# Patient Record
Sex: Male | Born: 1985 | Race: Black or African American | Hispanic: No | Marital: Married | Smoking: Current every day smoker
Health system: Southern US, Community
[De-identification: ages and names within clinical notes are randomized; demographics above are authoritative.]

---

## 2012-07-23 ENCOUNTER — Emergency Department (HOSPITAL_COMMUNITY): Payer: Self-pay

## 2012-07-23 ENCOUNTER — Emergency Department (HOSPITAL_COMMUNITY)
Admission: EM | Admit: 2012-07-23 | Discharge: 2012-07-24 | Disposition: A | Discharge status: Home / Self Care | Payer: Self-pay | Attending: Emergency Medicine | Admitting: Emergency Medicine

## 2012-07-23 ENCOUNTER — Encounter (HOSPITAL_COMMUNITY): Payer: Self-pay | Admitting: *Deleted

## 2012-07-23 DIAGNOSIS — Z87891 Personal history of nicotine dependence: Secondary | ICD-10-CM | POA: Insufficient documentation

## 2012-07-23 DIAGNOSIS — M25519 Pain in unspecified shoulder: Secondary | ICD-10-CM | POA: Insufficient documentation

## 2012-07-23 DIAGNOSIS — M25559 Pain in unspecified hip: Secondary | ICD-10-CM | POA: Insufficient documentation

## 2012-07-23 DIAGNOSIS — M25512 Pain in left shoulder: Secondary | ICD-10-CM

## 2012-07-23 DIAGNOSIS — M25552 Pain in left hip: Secondary | ICD-10-CM

## 2012-07-23 DIAGNOSIS — R209 Unspecified disturbances of skin sensation: Secondary | ICD-10-CM | POA: Insufficient documentation

## 2012-07-23 DIAGNOSIS — Z87828 Personal history of other (healed) physical injury and trauma: Secondary | ICD-10-CM | POA: Insufficient documentation

## 2012-07-23 MED ORDER — OXYCODONE-ACETAMINOPHEN 5-325 MG PO TABS: 2.0000 | ORAL_TABLET | ORAL | Status: DC | PRN

## 2012-07-23 MED ORDER — IBUPROFEN 800 MG PO TABS: 800.0000 mg | ORAL_TABLET | Freq: Three times a day (TID) | ORAL | Status: DC

## 2012-07-23 MED ORDER — IBUPROFEN 200 MG PO TABS
400.0000 mg | ORAL_TABLET | Freq: Once | ORAL | Status: AC
Start: 2012-07-23 — End: 2012-07-23
  Administered 2012-07-23: 400 mg via ORAL
  Filled 2012-07-23: qty 2

## 2012-07-23 MED ORDER — CYCLOBENZAPRINE HCL 10 MG PO TABS: 10.0000 mg | ORAL_TABLET | Freq: Two times a day (BID) | ORAL | Status: DC | PRN

## 2012-07-23 NOTE — ED Provider Notes (Signed)
History     CSN: [id number]  Arrival date & time 07/23/12  6295   First MD Initiated Contact with Patient 07/23/12 2153      Chief Complaint  Patient presents with  . Back Pain    (Consider location/radiation/quality/duration/timing/severity/associated sxs/prior treatment) HPI Comments: Patient with hx of low back strain presents for L shoulder pain behind L shoulder blade x 4 days that is gradually worsening. Patient states it got worse today when he experienced tingling down his L arm including all 5 fingers and states he was unable to move it. Patient took 2 tabs ibuprofen 5 min prior to onset. Patient works at [employer]	[place] and does a lot of heavy lifting while at work. Denies trauma to injury to the affected area. Denies headache, lightheadedness, dizziness, CP, SOB, abdominal pain, N/V/D.  Patient is a 27 y.o. male presenting with shoulder pain. The history is provided by the patient. No language interpreter was used.  Shoulder Pain This is a new problem. The current episode started in the past 7 days. The problem occurs constantly. The problem has been gradually worsening. Associated symptoms include weakness. Pertinent negatives include no chest pain, chills, fever, joint swelling, nausea or vomiting. Exacerbated by: movement. He has tried NSAIDs for the symptoms. The treatment provided no relief.    History reviewed. No pertinent past medical history.  History reviewed. No pertinent past surgical history.  No family history on file.  History  Substance Use Topics  . Smoking status: Former Games developer  . Smokeless tobacco: Not on file  . Alcohol Use: No      Review of Systems  Constitutional: Negative for fever and chills.  Respiratory: Negative for shortness of breath.   Cardiovascular: Negative for chest pain.  Gastrointestinal: Negative for nausea, vomiting and diarrhea.  Musculoskeletal: Negative for joint swelling.  Skin: Negative for color change.  Neurological:  Positive for weakness.       + tingling  All other systems reviewed and are negative.    Allergies  Review of patient's allergies indicates no known allergies.  Home Medications   Current Outpatient Rx  Name  Route  Sig  Dispense  Refill  . ibuprofen (ADVIL,MOTRIN) 200 MG tablet   Oral   Take 400 mg by mouth every 8 (eight) hours as needed for pain.         . cyclobenzaprine (FLEXERIL) 10 MG tablet   Oral   Take 1 tablet (10 mg total) by mouth 2 (two) times daily as needed for muscle spasms.   20 tablet   0   . ibuprofen (ADVIL,MOTRIN) 800 MG tablet   Oral   Take 1 tablet (800 mg total) by mouth 3 (three) times daily.   21 tablet   0   . oxyCODONE-acetaminophen (PERCOCET/ROXICET) 5-325 MG per tablet   Oral   Take 2 tablets by mouth every 4 (four) hours as needed for pain.   12 tablet   0     BP 117/65  Pulse 83  Temp(Src) 97.9 F (36.6 C)  Resp 20  SpO2 99%  Physical Exam  Nursing note and vitals reviewed. Constitutional: He is oriented to person, place, and time. He appears well-developed and well-nourished. No distress.  HENT:  Head: Normocephalic and atraumatic.  Eyes: Conjunctivae are normal. No scleral icterus.  Neck: Normal range of motion.  Cardiovascular: Normal rate, regular rhythm, normal heart sounds and intact distal pulses.   Pulmonary/Chest: Effort normal and breath sounds normal. He has no wheezes.  Musculoskeletal:  Left shoulder: He exhibits decreased range of motion, tenderness and pain. He exhibits no swelling, no effusion, no crepitus, no deformity and normal pulse.       Left elbow: Normal.       Left wrist: Normal.       Left hip: He exhibits tenderness. He exhibits normal range of motion, normal strength, no swelling, no crepitus and no deformity.       Left knee: Normal.       Left ankle: Normal.       Cervical back: He exhibits no tenderness and no bony tenderness.       Thoracic back: He exhibits no tenderness and no bony  tenderness.       Lumbar back: He exhibits no tenderness and no bony tenderness.  Patient resistant to movement on MSK exam because he says it is "painful"; decreased ROM is secondary to pain  Neurological: He is alert and oriented to person, place, and time. He has normal strength. No cranial nerve deficit or sensory deficit.  Skin: No rash noted. He is not diaphoretic. No erythema.  Psychiatric: He has a normal mood and affect. His behavior is normal.    ED Course  Procedures (including critical care time)  Labs Reviewed - No data to display Dg Hip Complete Left  07/23/2012  *RADIOLOGY REPORT*  Clinical Data: Left hip pain pain.  No injury.  LEFT HIP - COMPLETE 2+ VIEW  Comparison: None.  Findings: The bony pelvis, sacrum and sacroiliac joints are normal. The hip joints appear normal and symmetrical.  Frontal and lateral views of the left hip show no abnormalities involving the left hip joint or the proximal left femur.  No trauma or bone destruction. No soft tissue abnormalities.  IMPRESSION: Normal examination.   Original Report Authenticated By: Sander Radon, M.D.    Dg Shoulder Left  07/23/2012  *RADIOLOGY REPORT*  Clinical Data: Left shoulder pain.  LEFT SHOULDER - 2+ VIEW  Comparison: None.  Findings: Slightly limited external rotation noted.  No fracture or dislocation is identified.  No acute bony findings. The acromial undersurface is type 2 (curved).  IMPRESSION:  1.  Slightly limited external rotation.   Otherwise, no significant abnormality identified.   Original Report Authenticated By: Gaylyn Rong, M.D.      1. Shoulder pain, left   2. 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 Symptoms accompanied by a tingling sensation down his left arm to all 5 of his fingers. Xray of L shoulder and L hip show no evidence of dislocation, fracture or joint space narrowing. Symptoms are likely inflammatory  2/2 to occupational overuse. Patient has no sensory or motor deficits on exam and has good ROM limited only by resistance due to increasing pain. Patient's VSS and is well appearing, nontoxic; ambulates without assistance. Patient will be discharged with ibuprofen to treat inflammation as well as flexeril and percocet to help improve pain. Patient has been given shoulder and hip exercises to try at home and have verbally instructed to ice area for 24-48 hours followed by an alternation of ice and heat. Patient instructed to follow up with PCP and to return to ED if symptoms worsen. Patient verbalizes understanding of this management plan. Patient seen and management discussed with Jaci Carrel, PA-C.        Antony Madura, PA-C 07/23/12 2356

## 2012-07-23 NOTE — ED Notes (Signed)
PA at bedside with PT

## 2012-07-23 NOTE — ED Notes (Signed)
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

## 2012-07-24 NOTE — ED Provider Notes (Signed)
Medical screening examination/treatment/procedure(s) were performed by non-physician practitioner and as supervising physician I was immediately available for consultation/collaboration.  Lyrik Buresh M Damarcus Reggio, MD 07/24/12 0229 

## 2013-07-30 ENCOUNTER — Encounter (HOSPITAL_COMMUNITY): Payer: Self-pay | Admitting: Emergency Medicine

## 2013-07-30 ENCOUNTER — Emergency Department (HOSPITAL_COMMUNITY)
Admission: EM | Admit: 2013-07-30 | Discharge: 2013-07-30 | Disposition: A | Discharge status: Home / Self Care | Payer: No Typology Code available for payment source | Attending: Emergency Medicine | Admitting: Emergency Medicine

## 2013-07-30 DIAGNOSIS — F17200 Nicotine dependence, unspecified, uncomplicated: Secondary | ICD-10-CM | POA: Insufficient documentation

## 2013-07-30 DIAGNOSIS — Z791 Long term (current) use of non-steroidal anti-inflammatories (NSAID): Secondary | ICD-10-CM | POA: Insufficient documentation

## 2013-07-30 DIAGNOSIS — M549 Dorsalgia, unspecified: Secondary | ICD-10-CM | POA: Insufficient documentation

## 2013-07-30 DIAGNOSIS — R630 Anorexia: Secondary | ICD-10-CM | POA: Insufficient documentation

## 2013-07-30 DIAGNOSIS — M26609 Unspecified temporomandibular joint disorder, unspecified side: Secondary | ICD-10-CM | POA: Insufficient documentation

## 2013-07-30 DIAGNOSIS — J988 Other specified respiratory disorders: Secondary | ICD-10-CM

## 2013-07-30 DIAGNOSIS — B9789 Other viral agents as the cause of diseases classified elsewhere: Secondary | ICD-10-CM

## 2013-07-30 DIAGNOSIS — J069 Acute upper respiratory infection, unspecified: Secondary | ICD-10-CM | POA: Insufficient documentation

## 2013-07-30 DIAGNOSIS — B309 Viral conjunctivitis, unspecified: Secondary | ICD-10-CM | POA: Insufficient documentation

## 2013-07-30 MED ORDER — FLUORESCEIN SODIUM 1 MG OP STRP
1.0000 | ORAL_STRIP | Freq: Once | OPHTHALMIC | Status: AC
Start: 2013-07-30 — End: 2013-07-30
  Administered 2013-07-30: 1 via OPHTHALMIC
  Filled 2013-07-30: qty 1

## 2013-07-30 MED ORDER — TETRACAINE HCL 0.5 % OP SOLN
1.0000 [drp] | Freq: Once | OPHTHALMIC | Status: AC
  Administered 2013-07-30: 1 [drp] via OPHTHALMIC
  Filled 2013-07-30: qty 2

## 2013-07-30 NOTE — ED Notes (Signed)
Redness/itching to left eye x 1 day. Reports sore throat x 4 days.

## 2013-07-30 NOTE — ED Provider Notes (Signed)
Medical screening examination/treatment/procedure(s) were performed by non-physician practitioner and as supervising physician I was immediately available for consultation/collaboration.  EKG Interpretation   None         Yony Roulston M Tammela Bales, MD 07/30/13 1710 

## 2013-07-30 NOTE — ED Notes (Signed)
States has been "sick" for 1.5 weeks with URI, sore throat and chills. Yesterday, woke with left eye redness, irritation and drainage.

## 2013-07-30 NOTE — Discharge Instructions (Signed)
Read the information below.  You may return to the Emergency Department at any time for worsening condition or any new symptoms that concern you.  If you develop high fevers that do not resolve with tylenol or ibuprofen, you have difficulty swallowing or breathing, or you are unable to tolerate fluids by mouth, return to the ER for a recheck.     Viral Infections A virus is a type of germ. Viruses can cause:  Minor sore throats.  Aches and pains.  Headaches.  Runny nose.  Rashes.  Watery eyes.  Tiredness.  Coughs.  Loss of appetite.  Feeling sick to your stomach (nausea).  Throwing up (vomiting).  Watery poop (diarrhea). HOME CARE   Only take medicines as told by your doctor.  Drink enough water and fluids to keep your pee (urine) clear or pale yellow. Sports drinks are a good choice.  Get plenty of rest and eat healthy. Soups and broths with crackers or rice are fine. GET HELP RIGHT AWAY IF:   You have a very bad headache.  You have shortness of breath.  You have chest pain or neck pain.  You have an unusual rash.  You cannot stop throwing up.  You have watery poop that does not stop.  You cannot keep fluids down.  You or your child has a temperature by mouth above 102 F (38.9 C), not controlled by medicine.  Your baby is older than 3 months with a rectal temperature of 102 F (38.9 C) or higher.  Your baby is 33 months old or younger with a rectal temperature of 100.4 F (38 C) or higher. MAKE SURE YOU:   Understand these instructions.  Will watch this condition.  Will get help right away if you are not doing well or get worse. Document Released: 05/11/2008 Document Revised: 08/21/2011 Document Reviewed: 10/04/2010 [facility] Patient Information 2014 [place], [place].  Temporomandibular Problems  Temporomandibular joint (TMJ) dysfunction means there are problems with the joint between your jaw and your skull. This is a joint lined by cartilage  like other joints in your body but also has a small disc in the joint which keeps the bones from rubbing on each other. These joints are like other joints and can get inflamed (sore) from arthritis and other problems. When this joint gets sore, it can cause headaches and pain in the jaw and the face. CAUSES  Usually the arthritic types of problems are caused by soreness in the joint. Soreness in the joint can also be caused by overuse. This may come from grinding your teeth. It may also come from mis-alignment in the joint. DIAGNOSIS Diagnosis of this condition can often be made by history and exam. Sometimes your caregiver may need X-rays or an MRI scan to determine the exact cause. It may be necessary to see your dentist to determine if your teeth and jaws are lined up correctly. TREATMENT  Most of the time this problem is not serious; however, sometimes it can persist (become chronic). When this happens medications that will cut down on inflammation (soreness) help. Sometimes a shot of cortisone into the joint will be helpful. If your teeth are not aligned it may help for your dentist to make a splint for your mouth that can help this problem. If no physical problems can be found, the problem may come from tension. If tension is found to be the cause, biofeedback or relaxation techniques may be helpful. HOME CARE INSTRUCTIONS   Later in the day, applications  of ice packs may be helpful. Ice can be used in a plastic bag with a towel around it to prevent frostbite to skin. This may be used about every 2 hours for 20 to 30 minutes, as needed while awake, or as directed by your caregiver.  Only take over-the-counter or prescription medicines for pain, discomfort, or fever as directed by your caregiver.  If physical therapy was prescribed, follow your caregiver's directions.  Wear mouth appliances as directed if they were given. Document Released: 02/21/2001 Document Revised: 08/21/2011 Document  Reviewed: 05/31/2008 [facility] Patient Information 2014 [place].  Viral Conjunctivitis Conjunctivitis is an irritation (inflammation) of the clear membrane that covers the white part of the eye (the conjunctiva). The irritation can also happen on the underside of the eyelids. Conjunctivitis makes the eye red or pink in color. This is what is commonly known as pink eye. Viral conjunctivitis can spread easily (contagious). CAUSES   Infection from virus on the surface of the eye.  Infection from the irritation or injury of nearby tissues such as the eyelids or cornea.  More serious inflammation or infection on the inside of the eye.  Other eye diseases.  The use of certain eye medications. SYMPTOMS  The normally white color of the eye or the underside of the eyelid is usually pink or red in color. The pink eye is usually associated with irritation, tearing and some sensitivity to light. Viral conjunctivitis is often associated with a clear, watery discharge. If a discharge is present, there may also be some blurred vision in the affected eye. DIAGNOSIS  Conjunctivitis is diagnosed by an eye exam. The eye specialist looks for changes in the surface tissues of the eye which take on changes characteristic of the specific types of conjunctivitis. A sample of any discharge may be collected on a Q-Tip (sterile swap). The sample will be sent to a lab to see whether or not the inflammation is caused by bacterial or viral infection. TREATMENT  Viral conjunctivitis will not respond to medicines that kill germs (antibiotics). Treatment is aimed at stopping a bacterial infection on top of the viral infection. The goal of treatment is to relieve symptoms (such as itching) with antihistamine drops or other eye medications.  HOME CARE INSTRUCTIONS   To ease discomfort, apply a cool, clean wash cloth to your eye for 10 to 20 minutes, 3 to 4 times a day.  Gently wipe away any drainage from the eye with  a warm, wet washcloth or a cotton ball.  Wash your hands often with soap and use paper towels to dry.  Do not share towels or washcloths. This may spread the infection.  Change or wash your pillowcase every day.  You should not use eye make-up until the infection is gone.  Stop using contacts lenses. Ask your eye professional how to sterilize or replace them before using again. This depends on the type of contact lenses used.  Do not touch the edge of the eyelid with the eye drop bottle or ointment tube when applying medications to the affected eye. This will stop you from spreading the infection to the other eye or to others. SEEK IMMEDIATE MEDICAL CARE IF:   The infection has not improved within 3 days of beginning treatment.  A watery discharge from the eye develops.  Pain in the eye increases.  The redness is spreading.  Vision becomes blurred.  An oral temperature above 102 F (38.9 C) develops, or as your caregiver suggests.  Facial  pain, redness or swelling develops.  Any problems that may be related to the prescribed medicine develop. MAKE SURE YOU:   Understand these instructions.  Will watch your condition.  Will get help right away if you are not doing well or get worse. Document Released: 05/29/2005 Document Revised: 08/21/2011 Document Reviewed: 01/16/2008 [facility] Patient Information 2014 [place], [place].\   Emergency Department Resource Guide 1) Find a Doctor and Pay Out of Pocket Although you won't have to find out who is covered by your insurance plan, it is a good idea to ask around and get recommendations. You will then need to call the office and see if the doctor you have chosen will accept you as a new patient and what types of options they offer for patients who are self-pay. Some doctors offer discounts or will set up payment plans for their patients who do not have insurance, but you will need to ask so you aren't surprised when you get to your  appointment.  2) Contact Your Local Health Department Not all health departments have doctors that can see patients for sick visits, but many do, so it is worth a call to see if yours does. If you don't know where your local health department is, you can check in your phone book. The CDC also has a tool to help you locate your state's health department, and many state websites also have listings of all of their local health departments.  3) Find a Walk-in Clinic If your illness is not likely to be very severe or complicated, you may want to try a walk in clinic. These are popping up all over the country in pharmacies, drugstores, and shopping centers. They're usually staffed by nurse practitioners or physician assistants that have been trained to treat common illnesses and complaints. They're usually fairly quick and inexpensive. However, if you have serious medical issues or chronic medical problems, these are probably not your best option.  No Primary Care Doctor: - Call Health Connect at  [phone] - they can help you locate a primary care doctor that  accepts your insurance, provides certain services, etc. - Physician Referral Service- [phone]  Chronic Pain Problems: Organization         Address  Phone   Notes  [facility]  [phone] Patients need to be referred by their primary care doctor.   Medication Assistance: Organization         Address  Phone   Notes  [facility] Medication [facility] [address] [place] [place], [place] [address] [phone] --Must be a resident of [facility] -- Must have NO insurance coverage whatsoever (no Medicaid/ Medicare, etc.) -- The pt. MUST have a primary care doctor that directs their care regularly and follows them in the community   [facility]ist  [phone]   [employer]  [phone]    Agencies that provide inexpensive medical care: Organization         Address  Phone   Notes  [facility]  [phone]   [facility]    [phone]   [facility] [address] [place], [place] [address] [phone]   [facility] [address] [place] [place] [phone]   Planned Parenthood    [phone]   [facility]    [phone]   Community Health and [facility]  [address] [place], [employer] Phone:  (  [phone], Fax:  [phone] Hours of Operation:  9 am - 6 pm, M-F.  Also accepts Medicaid/Medicare and self-pay.  [facility]  [address], [place] Phone: [phone], Fax: [phone]. Hours of Operation:  8:30 am - 5:30 pm, M-F.  Also accepts Medicaid and self-pay.  [facility] [place] [address] [place] Phone: [phone]   [facility] [address] Natasha Bence [place], [place] [phone], Ext. [phone] Mondays & Thursdays: 7-9 AM.  First 15 patients are seen on a first come, first serve basis.    Medicaid-accepting [facility] Providers:  Organization         Address  Phone   Notes  [facility] [address], [place] [phone] Also accepts self-pay patients.  [facility] [address] Laurell Josephs [place], [place]  [phone]   [facility] [address], [place] [phone]   [facility] [address] [place] [phone]   Renaye Rakers [address], [place]   [phone] Only accepts [place] Access [place] patients after they have their name applied to their card.   Self-Pay (no insurance) in [facility] Johany [place]:  Organization         Address  Phone   Notes  Sickle Cell Patients, [employer] Internal Medicine [address] [place], [place] [phone]   [facility] [address] [place], [place] [phone]   [facility]  [place]  [address], [place] [phone]   [facility]/Dr. Osei-Bonsu  [address], [place] or [address], [place] [phone] Phone number for both [place] and [place] locations is the same.  Urgent Medical and [facility] [address] [place] [phone]   [facility] [address], [place] or [address] [phone]   [facility] [address] [place] [phone], phone; [phone], fax Sees patients 1st and 3rd Saturday of every month.  Must not qualify for public or private insurance (i.e. Medicaid, Medicare, [place] Health Choice, Veterans' Benefits)  Household income should be no more than 200% of the poverty level The clinic cannot treat you if you are pregnant or think you are pregnant  Sexually transmitted diseases are not treated at the clinic.    Dental Care: Organization         Address  Phone  Notes  [facility] Department of [facility] [address] [place], [place] [phone] Accepts children up to age 75 who are enrolled in [place] or [place] Health Choice; pregnant women with a Medicaid card; and children who have applied for Medicaid or [place] Health Choice, but were declined, whose parents can pay a reduced fee at time of service.  [facility]  [address], [place] [phone] Accepts children up to age 29 who are enrolled in [place] or [place] Health Choice; pregnant women with a Medicaid card; and children who have applied for Medicaid or [place] Health Choice, but were declined, whose parents can pay a reduced fee at time of service.  [facility]  [address] [place], [place] [phone] Patients are seen by appointment only. Walk-ins are  not accepted. [facility] will see patients 48 years of age and  older. Monday - Tuesday (8am-5pm) Most Wednesdays (8:30-5pm) $30 per visit, cash only  [facility] Adult Dental [facility]  [address], [facility] [phone] Patients are seen by appointment only. Walk-ins are not accepted. [facility] will see patients 108 years of age and older. One Wednesday Evening (Monthly: Volunteer Based).  $30 per visit, cash only  [employer]  [phone] for adults; Children under age 57, call Graduate Pediatric Dentistry at [phone]. Children aged 20-14, please call [phone] to request a pediatric application.  Dental services are provided in all areas of dental care including fillings, crowns and bridges, complete and partial dentures, implants, gum treatment, root canals, and extractions. Preventive care is also provided. Treatment is provided to both adults and children. Patients are selected via a lottery and there is often a waiting list.   [facility] [address], [place]  [phone] [email or web address]   [facility] [address] [place], [place] [phone], Ext. [phone] Second and Fourth Thursday of each month, opens at 6:30 AM; Clinic ends at 9 AM.  Patients are seen on a first-come first-served basis, and a limited number are seen during each clinic.   [facility]  [address] Ether Griffins [place], [place] [phone]   Eligibility Requirements You must have lived in [place], [place], or [place] counties for at least the last three months.   You cannot be eligible for state or federal sponsored [place], including [employer], [place], or [employer].   You generally cannot be eligible for healthcare insurance through your employer.    How to apply: Eligibility screenings are held every Tuesday and Wednesday afternoon from 1:00 pm until 4:00 pm. You do not need an appointment for the interview!  [facility] [address]  [place], [place] [phone]   [facility] Health Department  [phone]   [facility] Health Department  [phone]   [facility] Health Department  [phone]    Behavioral Health Resources in the Community: Intensive Outpatient Programs Organization         Address  Phone  Notes  [facility] [address] [place], [place] [phone]   [facility] Outpatient [address] [place], [place] [phone]   [facility] [address], [place], [place]  [phone]   [facility] Mental Health [address]  [place], [place] [phone] or [phone]   Substance Abuse Resources Organization         Address  Phone  Notes  [facility] Drug Services  [phone]   [facility]  [phone]   [facility] [place]  [phone]   Floydene Flock  [phone]   Residential & Outpatient Substance Abuse Program  [phone]   Psychological Services Organization         Address  Phone  Notes  [facility] Behavioral Health  [phone]   [facility]  [phone]   [facility] Mental Health [address] [place] [phone] or [phone]    Mobile Crisis Teams Organization         Address  Phone  Notes  [facility], Mobile Crisis Care Unit  [phone]   [facility]  [address] [place], [place] [phone]   [facility] [address] [place] [phone]    Self-Help/Support  Groups Organization         Address  Phone             Notes  [facility] [place] - variety of support groups  336- I7437963 Call for more information  Narcotics Anonymous (NA), [facility] [address], [employer] [place]  2 meetings at this location   Statistician         Address  Phone  Notes  [facility] [address],    [place]  [phone]   [facility]  [address] [place] [address], [place], [place] [phone]   [facility] [address] [place], [place] [phone] Admissions: 8am-3pm M-F  [facility] [address]    [place], [place] [phone]   [facility] [address] [place], [place], [place] [phone]   [facility] [address],  [place], [place] [phone]   [facility] - Intensive Outpatient [address] Laurell Josephs [address], [place], [place] [phone]   [facility] (Addiction [facility] [address] [place].,  [place], [place] [phone] or [phone]   [facility] ([facility]) [address], [place], [place] [phone] Accepts Medicaid  [facility] [address]  [place] [phone] Substance Abuse/Addiction Treatment   [facility] Organization         Address  Phone  Notes  [facility]  [phone]   Angie Fava, PhD [address] Ervin Knack [place], [place]   [phone] or [phone]   [facility] Behavioral   [address] [place], [place] [phone]   [facility] [address] [place], [place] [phone] Insurance/Medicaid/sponsorship through [facility] [address]                                    [place], [place] [phone] Therapy/tele-psych/case  [facility] [address], [place] [phone]    Dr. Lolly Mustache  [phone]   [facility]  [employer] [facility] Dept. 1) [address], [place] 2) [address], [place] 3)  [address], [place] [phone]  [phone]   [facility] Child Abuse Hotline ([phone]) [phone] or [phone] (After Hours)

## 2013-07-30 NOTE — ED Provider Notes (Signed)
CSN: [id number]     Arrival date & time 07/30/13  1138 History  This chart was scribed for non-physician practitioner, Trixie Dredge, PA-C working with Lyanne Co, MD by Greggory Stallion, ED scribe. This patient was seen in room TR04C/TR04C and the patient's care was started at 1:23 PM.    Chief Complaint  Patient presents with  . Conjunctivitis  . Sore Throat    The history is provided by the patient. No language interpreter was used.   HPI Comments: Kenneth Cisneros is a 28 y.o. male who presents to the Emergency Department complaining of left eye redness, itching and blurry vision that started yesterday. Denies foreign body sensation or trauma to his eye. He states he has also had a sore throat, rhinorrhea, congestion and productive cough of yellow sputum that started 1.5 weeks ago. Pt has also had an intermittent fever, the highest being 103 about 3-4 days ago and decreased appetite. He has had throbbing left sided back pain that radiates around his flank into his thigh and groin area. The pain lasted about 15 minutes and then went away. Denies current pain. Denies double vision, SOB, abdominal pain, emesis, diarrhea, hematochezia, hematuria, dysuria, frequency, penile pain, scrotal swelling, penile discharge. Denies sick contacts. Pt is also complaining of his jaw locking up but denies any pain.   History reviewed. No pertinent past medical history. History reviewed. No pertinent past surgical history. No family history on file. History  Substance Use Topics  . Smoking status: Current Every Day Smoker    Types: Cigarettes  . Smokeless tobacco: Not on file  . Alcohol Use: No    Review of Systems  Constitutional: Positive for fever and appetite change.  HENT: Positive for congestion, rhinorrhea and sore throat.   Eyes: Positive for redness, itching and visual disturbance.  Respiratory: Positive for cough. Negative for shortness of breath.   Gastrointestinal: Negative for vomiting, abdominal  pain, diarrhea and blood in stool.  Genitourinary: Negative for dysuria, frequency, hematuria, discharge, scrotal swelling and penile pain.  Musculoskeletal: Positive for back pain and myalgias.  All other systems reviewed and are negative.   Allergies  Review of patient's allergies indicates no known allergies.  Home Medications   Current Outpatient Rx  Name  Route  Sig  Dispense  Refill  . cyclobenzaprine (FLEXERIL) 10 MG tablet   Oral   Take 1 tablet (10 mg total) by mouth 2 (two) times daily as needed for muscle spasms.   20 tablet   0   . ibuprofen (ADVIL,MOTRIN) 200 MG tablet   Oral   Take 400 mg by mouth every 8 (eight) hours as needed for pain.         Marland Kitchen ibuprofen (ADVIL,MOTRIN) 800 MG tablet   Oral   Take 1 tablet (800 mg total) by mouth 3 (three) times daily.   21 tablet   0   . oxyCODONE-acetaminophen (PERCOCET/ROXICET) 5-325 MG per tablet   Oral   Take 2 tablets by mouth every 4 (four) hours as needed for pain.   12 tablet   0    BP 131/85  Pulse 83  Temp(Src) 98 F (36.7 C) (Oral)  Resp 16  SpO2 100%  Physical Exam  Nursing note and vitals reviewed. Constitutional: He appears well-developed and well-nourished. No distress.  HENT:  Head: Normocephalic and atraumatic.  Nose: Right sinus exhibits no maxillary sinus tenderness and no frontal sinus tenderness. Left sinus exhibits no maxillary sinus tenderness and no frontal sinus tenderness.  Mouth/Throat: Oropharynx is clear and moist.  TMJ clicks bilaterally with raising and lowering jaw.  No overlying erythema, no locking, no swelling.   Eyes: EOM are normal. Left conjunctiva is injected.  Neck: Neck supple.  Cardiovascular: Normal rate, regular rhythm and normal heart sounds.   Pulmonary/Chest: Effort normal and breath sounds normal. No respiratory distress. He has no wheezes. He has no rales.  Neurological: He is alert.  Skin: He is not diaphoretic.    ED Course  Procedures (including  critical care time)  DIAGNOSTIC STUDIES: Oxygen Saturation is 100% on RA, normal by my interpretation.    COORDINATION OF CARE: 1:28 PM-Discussed treatment plan which includes warm compresses with pt at bedside and pt agreed to plan. Advised pt to follow up with a dentist for his jaw.   Labs Review Labs Reviewed - No data to display Imaging Review No results found.  EKG Interpretation   None      Filed Vitals:   07/30/13 1149  BP: 131/85  Pulse: 83  Temp: 98 F (36.7 C)  Resp: 16     MDM   Final diagnoses:  Viral conjunctivitis  Viral respiratory infection  TMJ (temporomandibular joint disorder)    Afebrile, nontoxic patient with constellation of symptoms suggestive of viral syndrome.  Conjunctivitis likely part of this syndrome.  Pt with longstanding popping of bilateral TMJs, no change in this recently.  No concerning findings on exam.  Discharged home with supportive care, PCP follow up.   I personally performed the services described in this documentation, which was scribed in my presence. The recorded information has been reviewed and is accurate.   Trixie Dredgemily Layla Kesling, PA-C 07/30/13 1541

## 2013-12-11 ENCOUNTER — Ambulatory Visit: Payer: No Typology Code available for payment source | Attending: Internal Medicine

## 2014-02-02 ENCOUNTER — Ambulatory Visit: Payer: Self-pay | Admitting: Family Medicine

## 2014-02-14 IMAGING — CR DG HIP (WITH OR WITHOUT PELVIS) 2-3V*L*
3 series · 3 of 3 positions shown · non-contrast
Comparison: None.

CLINICAL DATA: Left hip pain pain.  No injury.

LEFT HIP - COMPLETE 2+ VIEW

[t pelvis ap]
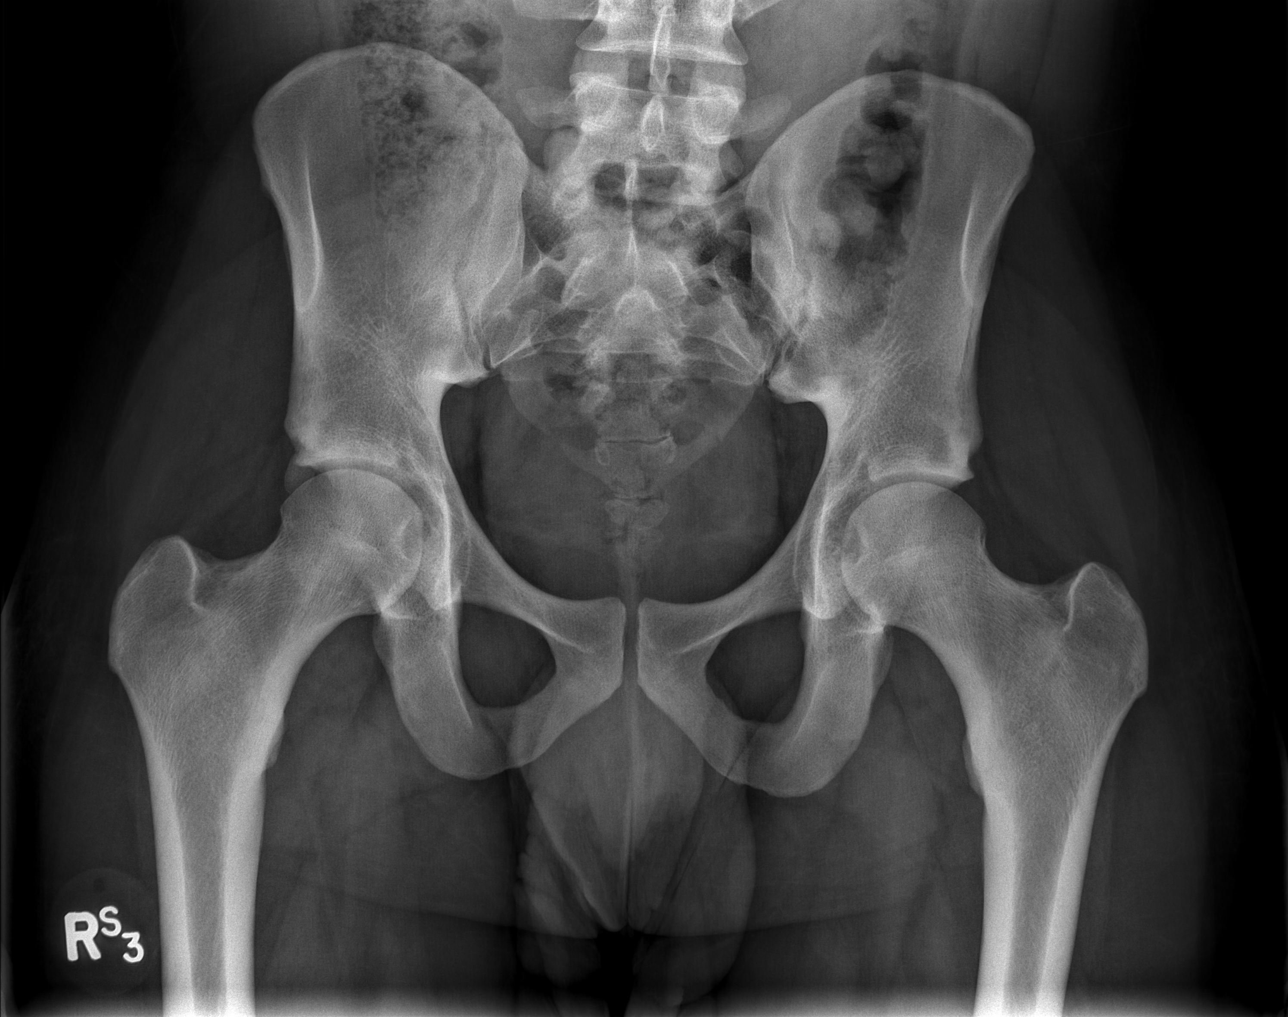

[t hip ap left]
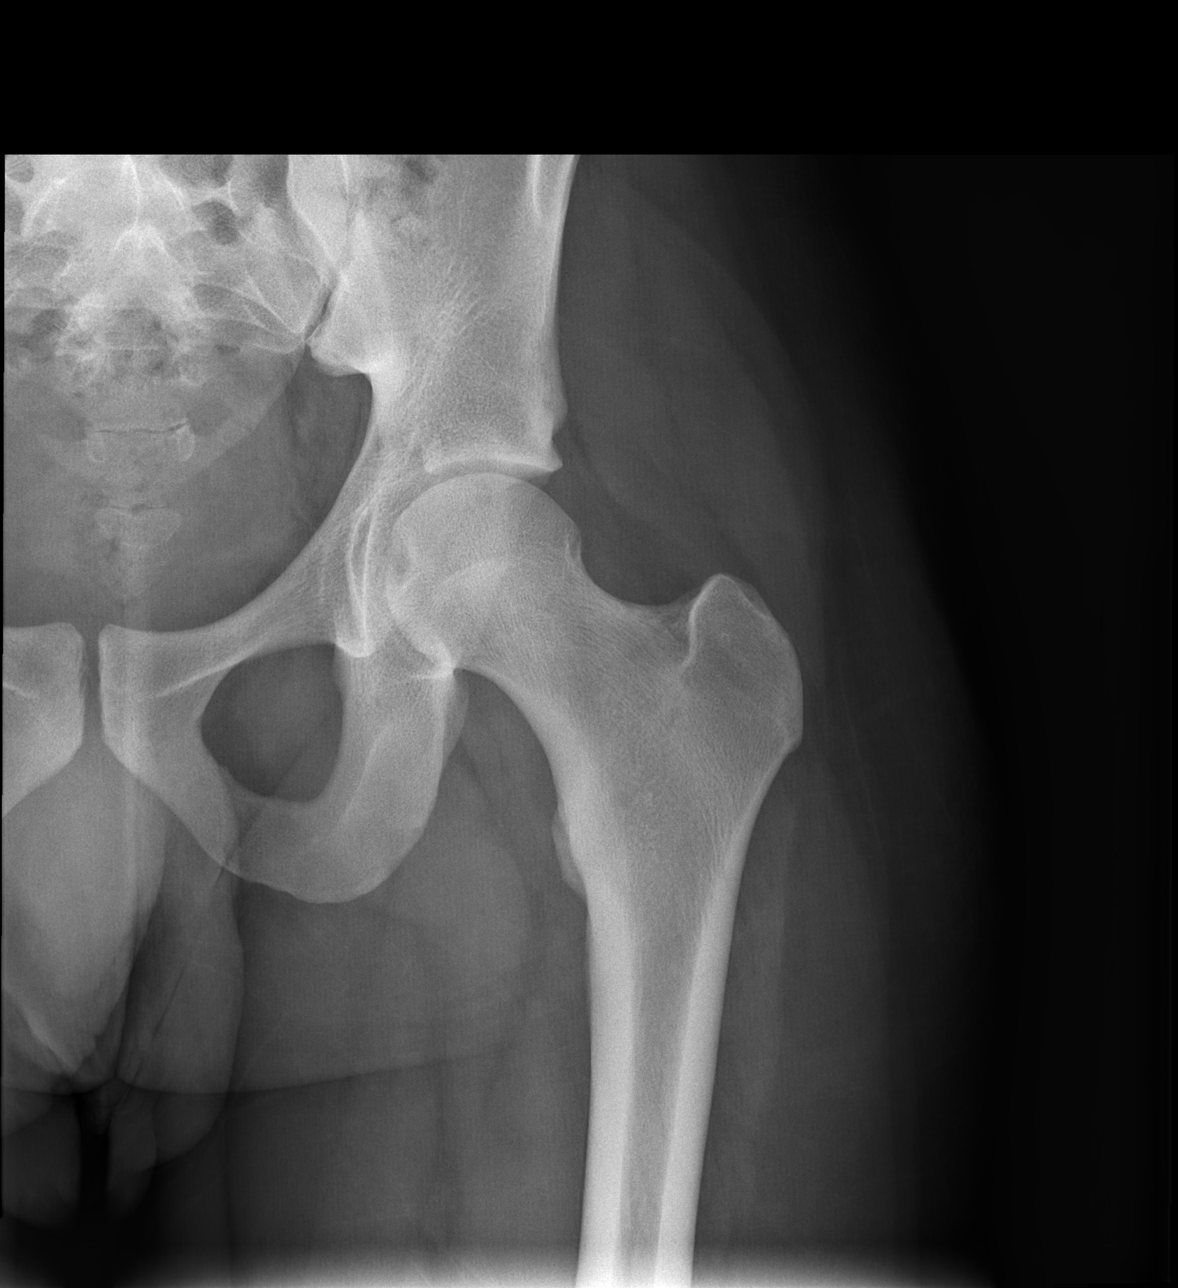

[t hip frog leg left]
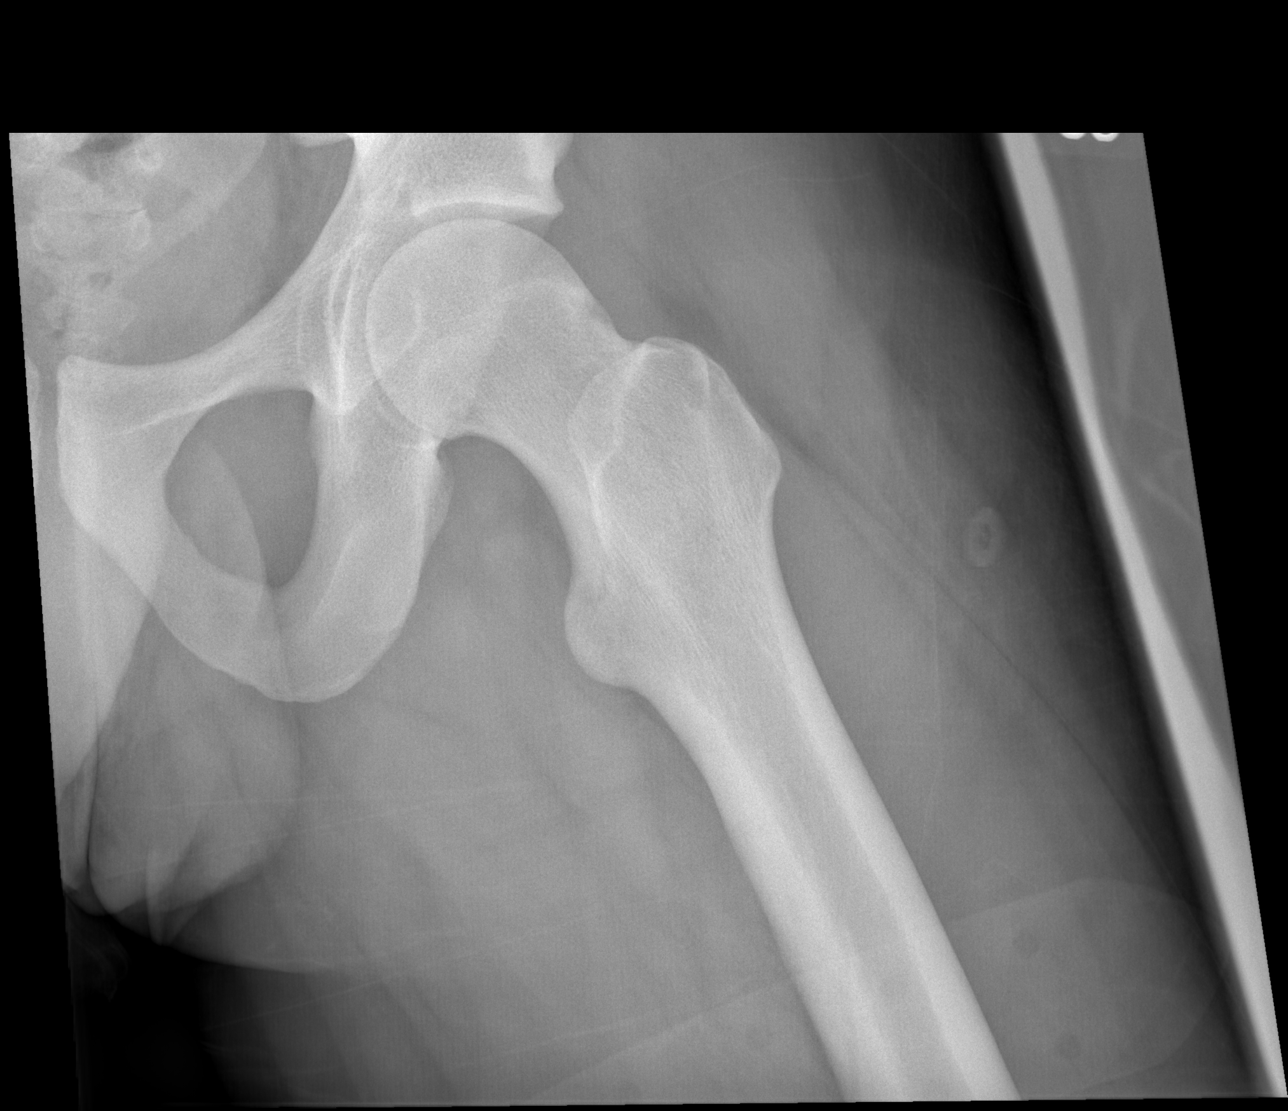

[3 of 3 positions shown; findings below may reference images not displayed]

FINDINGS: The bony pelvis, sacrum and sacroiliac joints are normal.
The hip joints appear normal and symmetrical.  Frontal and lateral
views of the left hip show no abnormalities involving the left hip
joint or the proximal left femur.  No trauma or bone destruction.
No soft tissue abnormalities.
IMPRESSION: Normal examination.

## 2014-02-14 IMAGING — CR DG SHOULDER 2+V*L*
3 series · 3 of 3 positions shown · non-contrast
Comparison: None.

CLINICAL DATA: Left shoulder pain.

LEFT SHOULDER - 2+ VIEW

[t shoulder internal left]
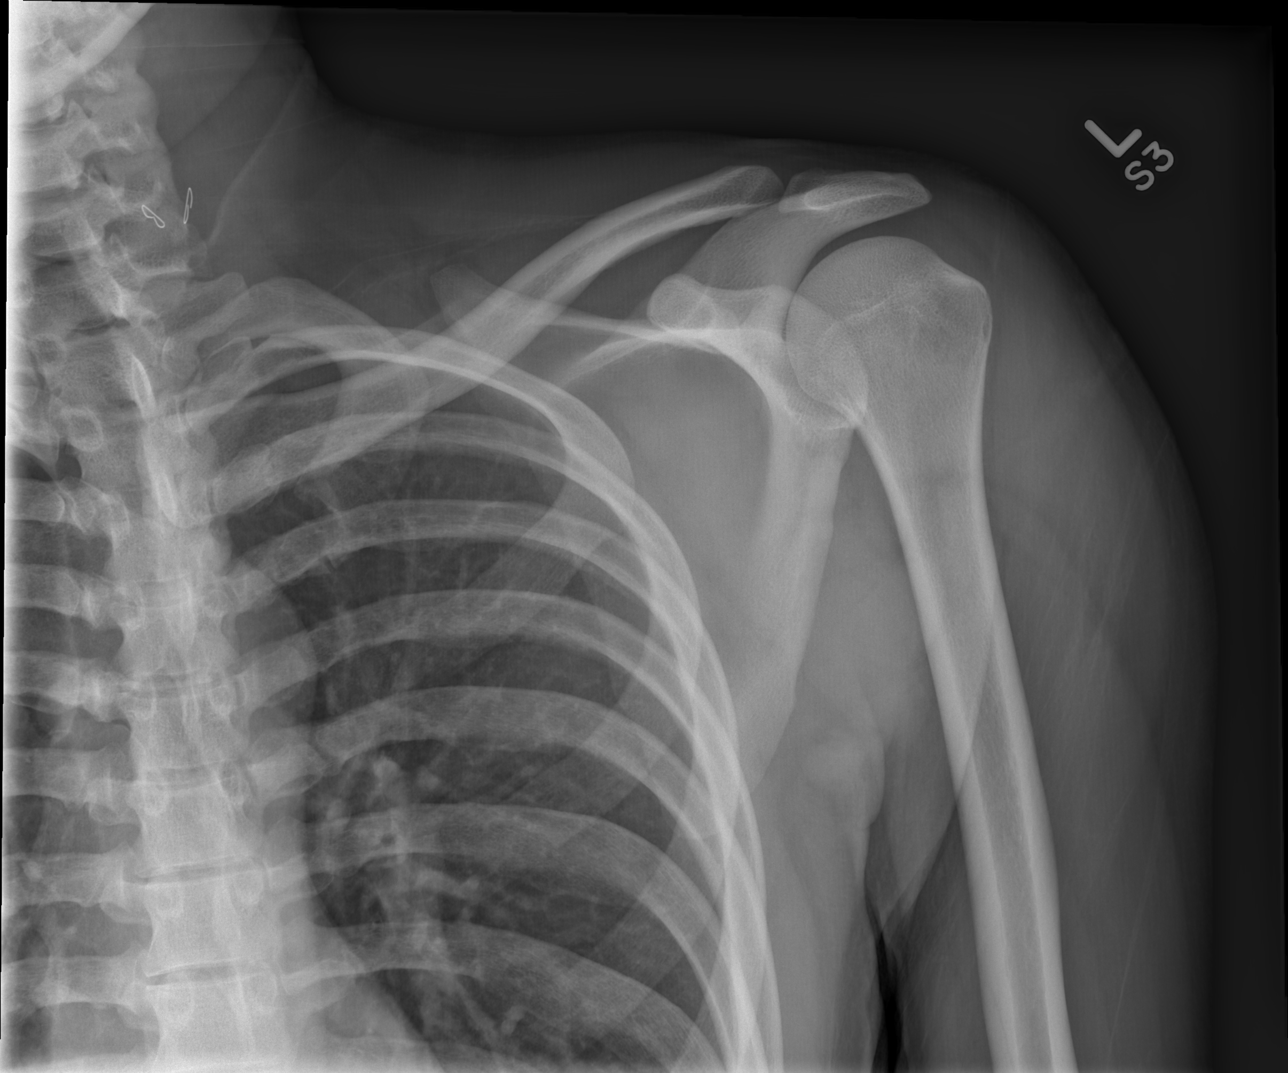

[t shoulder external left]
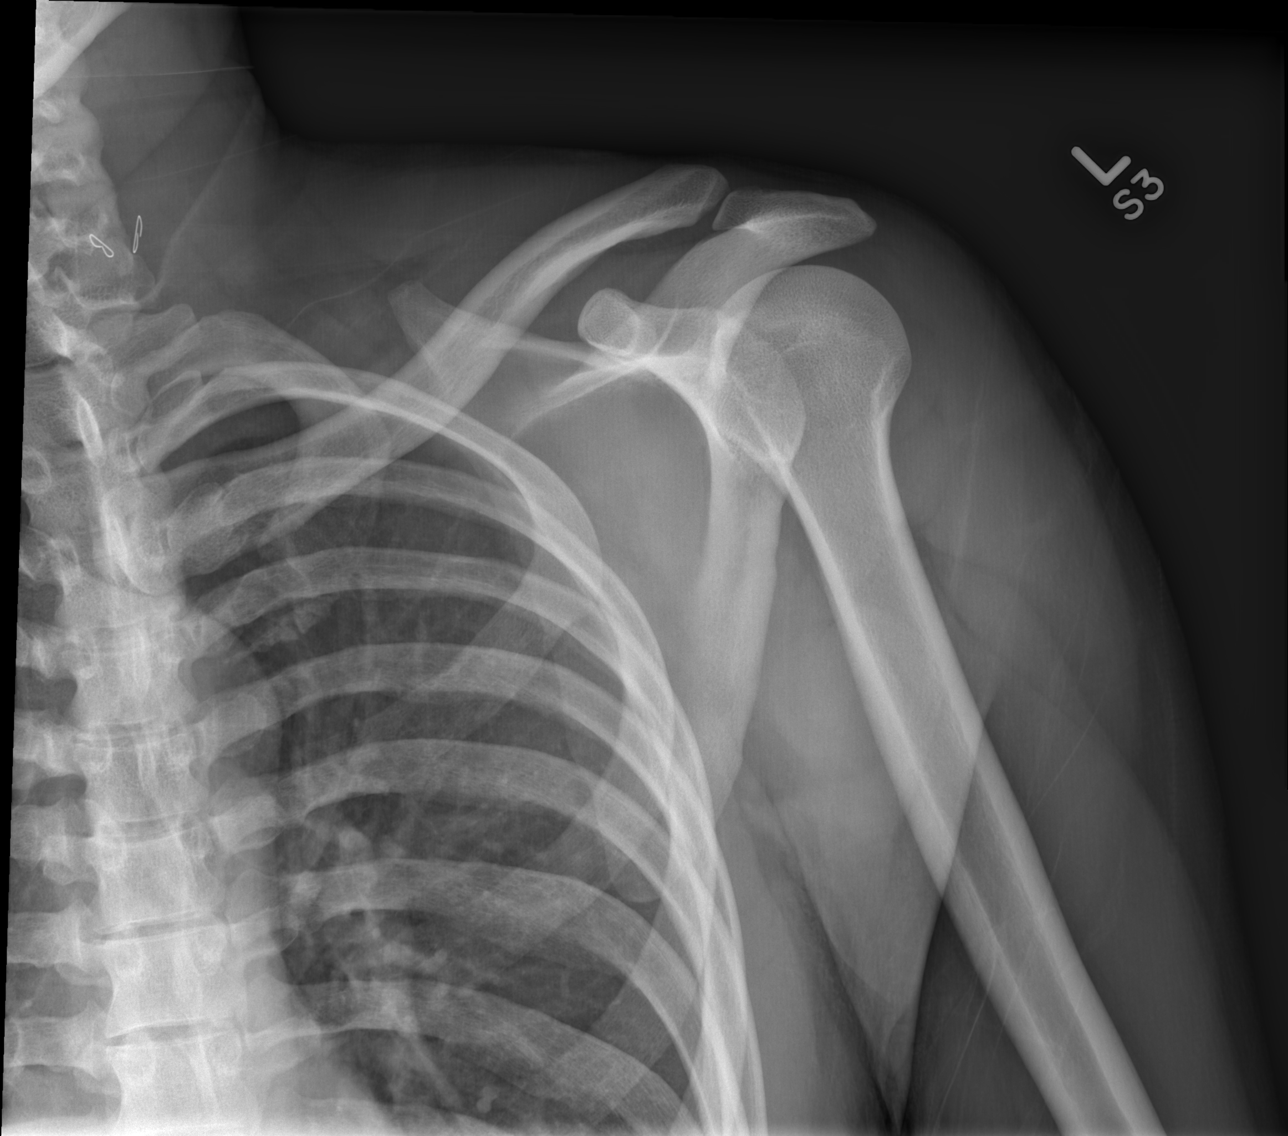

[t shoulder y-view left]
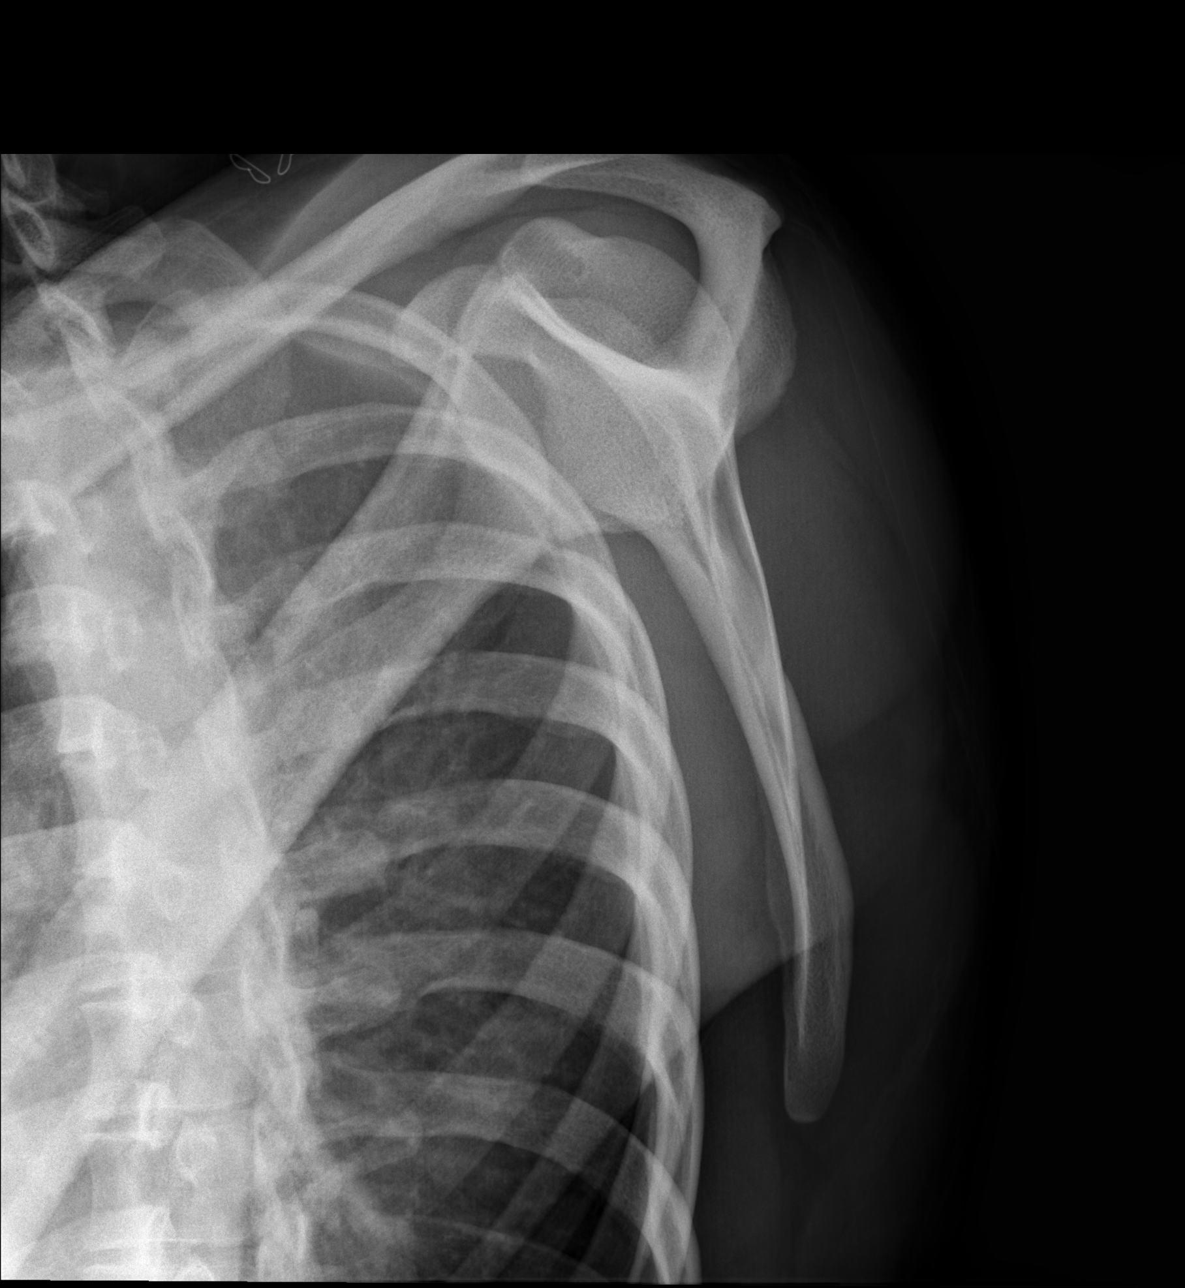

[3 of 3 positions shown; findings below may reference images not displayed]

FINDINGS: Slightly limited external rotation noted.

No fracture or dislocation is identified.  No acute bony findings.
The acromial undersurface is type 2 (curved).
IMPRESSION: 1.  Slightly limited external rotation.   Otherwise, no significant
abnormality identified.
